# Patient Record
Sex: Male | Born: 1965 | Race: Black or African American | Hispanic: No | Marital: Married | Smoking: Never smoker
Health system: Southern US, Community
[De-identification: ages and names within clinical notes are randomized; demographics above are authoritative.]

## PROBLEM LIST (undated history)

## (undated) DIAGNOSIS — Z789 Other specified health status: Secondary | ICD-10-CM

## (undated) DIAGNOSIS — E669 Obesity, unspecified: Secondary | ICD-10-CM

## (undated) DIAGNOSIS — E785 Hyperlipidemia, unspecified: Secondary | ICD-10-CM

## (undated) HISTORY — PX: CLUB FOOT RELEASE: SHX1363

## (undated) HISTORY — DX: Hyperlipidemia, unspecified: E78.5

---

## 2006-05-24 ENCOUNTER — Emergency Department: Payer: Self-pay

## 2007-08-02 ENCOUNTER — Emergency Department: Payer: Self-pay | Admitting: Emergency Medicine

## 2011-06-22 ENCOUNTER — Emergency Department: Payer: Self-pay | Admitting: *Deleted

## 2016-01-01 ENCOUNTER — Other Ambulatory Visit: Payer: Self-pay | Admitting: Podiatry

## 2016-01-01 ENCOUNTER — Ambulatory Visit (INDEPENDENT_AMBULATORY_CARE_PROVIDER_SITE_OTHER): Payer: BLUE CROSS/BLUE SHIELD | Admitting: Podiatry

## 2016-01-01 ENCOUNTER — Ambulatory Visit: Payer: BLUE CROSS/BLUE SHIELD

## 2016-01-01 ENCOUNTER — Encounter: Payer: Self-pay | Admitting: Podiatry

## 2016-01-01 ENCOUNTER — Ambulatory Visit (INDEPENDENT_AMBULATORY_CARE_PROVIDER_SITE_OTHER): Payer: BLUE CROSS/BLUE SHIELD

## 2016-01-01 DIAGNOSIS — R52 Pain, unspecified: Secondary | ICD-10-CM | POA: Diagnosis not present

## 2016-01-01 DIAGNOSIS — M19079 Primary osteoarthritis, unspecified ankle and foot: Secondary | ICD-10-CM | POA: Insufficient documentation

## 2016-01-01 DIAGNOSIS — M19072 Primary osteoarthritis, left ankle and foot: Secondary | ICD-10-CM

## 2016-01-01 DIAGNOSIS — Z8776 Personal history of (corrected) congenital malformations of integument, limbs and musculoskeletal system: Secondary | ICD-10-CM

## 2016-01-01 DIAGNOSIS — Z87768 Personal history of other specified (corrected) congenital malformations of integument, limbs and musculoskeletal system: Secondary | ICD-10-CM | POA: Insufficient documentation

## 2016-01-01 NOTE — Progress Notes (Signed)
   Subjective:    Patient ID: Jeffery Long, male    DOB: Sep 26, 1965, 50 y.o.   MRN: [id number]  HPI   50 year old male presents the office today for concerns of left ankle pain which is been ongoing for quite some time. 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 He previously tried over-the-counter ankle brace which helps and he wears it. He works standing on concrete floors which she was area of symptoms. He states he is not working the pain subsides. No recent injury or trauma. No swelling or redness. No tenderness. No other complaints at this time.  Review of Systems  All other systems reviewed and are negative.      Objective:   Physical Exam General: AAO x3, NAD  Dermatological: Scars present from prior surgery and the posterior medial aspect of the calf as well as the medial foot. There are no open sores, no preulcerative lesions, no rash or signs of infection present.  Vascular: Dorsalis Pedis artery and Posterior Tibial artery pedal pulses are 2/4 bilateral with immedate capillary fill time. Pedal hair growth present. No varicosities and no lower extremity edema present bilateral. There is no pain with calf compression, swelling, warmth, erythema.   Neruologic: Grossly intact via light touch bilateral. Vibratory intact via tuning fork bilateral. Protective threshold with Semmes Wienstein monofilament intact to all pedal sites bilateral. Patellar and Achilles deep tendon reflexes 2+ bilateral. No Babinski or clonus noted bilateral.   Musculoskeletal: There is decreased range of motion of both ankle and subtalar joint with mild crepitus with range of motion. There is mild discomfort with subtalar joint range of motion. No pain with ankle joint range of motion. The heels in rectus alignment. There is mild forefoot adductus and elevation of the first ray. No specific area pinpoint tenderness there is no pain vibratory sensation. MMT 5/5.   Gait:  Unassisted, Nonantalgic.     Assessment & Plan:  50 year old male with history of clubfoot with ankle, subtalar joint arthritis  -Treatment options discussed including all alternatives, risks, and complications -Etiology of symptoms were discussed -X-rays were obtained and reviewed with the patient. Arthritic changes present of the ankle and subtalar joint. No evidence of acute fracture.  -At this time a discussed both conservative and surgical treatment options with the patient. At this point he would hold off on any surgical intervention. He likely benefit from an AFO brace. I'll have him follow-up with Betha.   Ovid CurdMatthew Wagoner, DPM

## 2016-01-21 ENCOUNTER — Ambulatory Visit (INDEPENDENT_AMBULATORY_CARE_PROVIDER_SITE_OTHER): Payer: BLUE CROSS/BLUE SHIELD | Admitting: *Deleted

## 2016-01-21 DIAGNOSIS — Z8776 Personal history of (corrected) congenital malformations of integument, limbs and musculoskeletal system: Secondary | ICD-10-CM

## 2016-01-21 DIAGNOSIS — M19072 Primary osteoarthritis, left ankle and foot: Secondary | ICD-10-CM

## 2016-01-21 DIAGNOSIS — Z87768 Personal history of other specified (corrected) congenital malformations of integument, limbs and musculoskeletal system: Secondary | ICD-10-CM

## 2016-01-30 NOTE — Progress Notes (Signed)
Casted for AFO brace. 

## 2016-02-18 ENCOUNTER — Ambulatory Visit (INDEPENDENT_AMBULATORY_CARE_PROVIDER_SITE_OTHER): Payer: BLUE CROSS/BLUE SHIELD | Admitting: Podiatry

## 2016-02-18 DIAGNOSIS — M19072 Primary osteoarthritis, left ankle and foot: Secondary | ICD-10-CM | POA: Diagnosis not present

## 2016-02-18 DIAGNOSIS — Z8776 Personal history of (corrected) congenital malformations of integument, limbs and musculoskeletal system: Secondary | ICD-10-CM | POA: Diagnosis not present

## 2016-02-18 DIAGNOSIS — Z87768 Personal history of other specified (corrected) congenital malformations of integument, limbs and musculoskeletal system: Secondary | ICD-10-CM

## 2016-03-01 NOTE — Progress Notes (Signed)
Dispensed AFO brace. Follow up as needed.

## 2016-03-16 DIAGNOSIS — R7303 Prediabetes: Secondary | ICD-10-CM | POA: Insufficient documentation

## 2016-03-16 DIAGNOSIS — E7800 Pure hypercholesterolemia, unspecified: Secondary | ICD-10-CM | POA: Insufficient documentation

## 2016-03-19 ENCOUNTER — Emergency Department
Admission: EM | Admit: 2016-03-19 | Discharge: 2016-03-19 | Disposition: A | Discharge status: Home / Self Care | Payer: BLUE CROSS/BLUE SHIELD | Attending: Student in an Organized Health Care Education/Training Program | Admitting: Student in an Organized Health Care Education/Training Program

## 2016-03-19 DIAGNOSIS — R21 Rash and other nonspecific skin eruption: Secondary | ICD-10-CM | POA: Diagnosis present

## 2016-03-19 DIAGNOSIS — T7840XA Allergy, unspecified, initial encounter: Secondary | ICD-10-CM | POA: Insufficient documentation

## 2016-03-19 NOTE — ED Notes (Signed)
Discharge instructions reviewed with patient. Patient verbalized understanding. Patient ambulated to lobby without difficulty.   

## 2016-03-19 NOTE — ED Provider Notes (Signed)
[facility] Emergency Department Provider Note   ____________________________________________   First MD Initiated Contact with Patient 03/19/16 1957     (approximate)  I have reviewed the triage vital signs and the nursing notes.   HISTORY  Chief Complaint Rash    HPI Jeffery Long is a 50 y.o. male who presents today for evaluation of systemic redness and itching x3 days. Patient states that the itching started after the initiation of Lipitor by his PCP. The itching starts approx. 30 minutes of taking Lipitor at night. Patient states that his whole body becomes itchy. He denies a rash but notes that there is some redness. Patient has not been taking anti-histamine medications to alleviate symptoms. He denies angioedema, SOB, or wheezing.  No past medical history on file.  Patient Active Problem List   Diagnosis Date Noted  . History of clubfoot 01/01/2016  . Osteoarthritis of ankle and foot 01/01/2016    No past surgical history on file.  Prior to Admission medications   Not on File    Allergies Review of patient's allergies indicates no known allergies.  No family history on file.  Social History Social History  Substance Use Topics  . Smoking status: Never Smoker  . Smokeless tobacco: Never Used  . Alcohol use No    Review of Systems Constitutional: No fever/chills Eyes: No visual changes. ENT: No sore throat, no difficulty swallowing Cardiovascular: Denies chest pain. Respiratory: Denies shortness of breath. Gastrointestinal: No abdominal pain.  No nausea, no vomiting.  No diarrhea.  No constipation. Skin: Positive redness and itching Neurological: Negative for headaches, focal weakness or numbness. 10-point ROS otherwise negative.  ____________________________________________   PHYSICAL EXAM:  VITAL SIGNS: ED Triage Vitals [03/19/16 1938]  Enc Vitals Group     BP (!) 145/93     Pulse Rate 73     Resp 16     Temp  98.6 F (37 C)     Temp Source Oral     SpO2 100 %     Weight 242 lb (109.8 kg)     Height 5\' 11"  (1.803 m)     Head Circumference      Peak Flow      Pain Score      Pain Loc      Pain Edu?      Excl. in GC?     Constitutional: Alert and oriented. Well appearing and in no acute distress. Eyes: Conjunctivae are normal.  Head: Atraumatic. No angioedema Mouth/Throat: Mucous membranes are moist.  Oropharynx non-erythematous.   Neck: No stridor.   Cardiovascular: Normal rate, regular rhythm. Grossly normal heart sounds.  Good peripheral circulation. Respiratory: Normal respiratory effort.  No retractions. Lungs CTAB. Musculoskeletal: No lower extremity tenderness nor edema.  No joint effusions. Neurologic:  Normal speech and language. No gross focal neurologic deficits are appreciated. No gait instability. Skin:  Skin is warm, dry and intact. No rash noted. No swelling, redness, or excoriations noted Psychiatric: Mood and affect are normal. Speech and behavior are normal.  ____________________________________________   LABS (all labs ordered are listed, but only abnormal results are displayed)  Labs Reviewed - No data to display ____________________________________________  ____________________________________________   PROCEDURES  Procedure(s) performed: None  Procedures  Critical Care performed: No  ____________________________________________   INITIAL IMPRESSION / ASSESSMENT AND PLAN / ED COURSE  Pertinent labs & imaging results that were available during my care of the patient were reviewed by me and considered in my  medical decision making (see chart for details).  Patient has been instructed to discontinue Lipitor and take anti-histamines for symptoms. He should follow up with his PCP in the next week for new cholesterol medications. He has been instructed to be aware of facial swelling or difficulty breathing, in which he should report back to the  ED.  Clinical Course     ____________________________________________   FINAL CLINICAL IMPRESSION(S) / ED DIAGNOSES  Final diagnoses:  Allergic reaction, initial encounter      NEW MEDICATIONS STARTED DURING THIS VISIT:  There are no discharge medications for this patient.    Note:  This document was prepared using Dragon voice recognition software and may include unintentional dictation errors.   Evangeline Dakin, PA-C 03/19/16 2200    Willy Eddy, MD 03/20/16 0100

## 2016-03-19 NOTE — ED Triage Notes (Signed)
Pt states was recently prescribed lipitor. Pt states he takes lipitor at night and approx 30 min after taking he has itching and redness to bilateral forearms. Pt appears in no acute distress, small amount of redness noted to right anterior mid forearm.

## 2016-04-01 ENCOUNTER — Ambulatory Visit: Payer: BLUE CROSS/BLUE SHIELD | Admitting: Podiatry

## 2016-04-08 ENCOUNTER — Ambulatory Visit: Payer: BLUE CROSS/BLUE SHIELD | Admitting: Podiatry

## 2016-06-03 ENCOUNTER — Encounter: Payer: Self-pay | Admitting: *Deleted

## 2016-06-04 ENCOUNTER — Ambulatory Visit: Payer: BLUE CROSS/BLUE SHIELD | Admitting: Anesthesiology

## 2016-06-04 ENCOUNTER — Encounter
Admission: RE | Disposition: A | Discharge status: Home / Self Care | Payer: Self-pay | Source: Ambulatory Visit | Attending: Unknown Physician Specialty

## 2016-06-04 ENCOUNTER — Ambulatory Visit
Admission: RE | Admit: 2016-06-04 | Discharge: 2016-06-04 | Disposition: A | Discharge status: Home / Self Care | Payer: BLUE CROSS/BLUE SHIELD | Source: Ambulatory Visit | Attending: Unknown Physician Specialty | Admitting: Unknown Physician Specialty

## 2016-06-04 ENCOUNTER — Encounter: Payer: Self-pay | Admitting: *Deleted

## 2016-06-04 DIAGNOSIS — Z1211 Encounter for screening for malignant neoplasm of colon: Secondary | ICD-10-CM | POA: Diagnosis not present

## 2016-06-04 DIAGNOSIS — Z538 Procedure and treatment not carried out for other reasons: Secondary | ICD-10-CM | POA: Insufficient documentation

## 2016-06-04 HISTORY — DX: Other specified health status: Z78.9

## 2016-06-04 HISTORY — DX: Obesity, unspecified: E66.9

## 2016-06-04 SURGERY — COLONOSCOPY WITH PROPOFOL
Anesthesia: General

## 2016-06-04 NOTE — Anesthesia Preprocedure Evaluation (Deleted)
Anesthesia Evaluation  Patient identified by MRN, date of birth, ID band Patient awake    Reviewed: Allergy & Precautions, H&P , NPO status , Patient's Chart, lab work & pertinent test results  History of Anesthesia Complications Negative for: history of anesthetic complications  Airway Mallampati: II  TM Distance: >3 FB Neck ROM: full    Dental  (+) Poor Dentition, Chipped, Missing   Pulmonary neg pulmonary ROS, neg shortness of breath,    Pulmonary exam normal breath sounds clear to auscultation       Cardiovascular Exercise Tolerance: Good (-) angina(-) Past MI and (-) DOE negative cardio ROS Normal cardiovascular exam Rhythm:regular Rate:Normal     Neuro/Psych negative neurological ROS  negative psych ROS   GI/Hepatic Neg liver ROS, GERD  Controlled,  Endo/Other  negative endocrine ROS  Renal/GU negative Renal ROS  negative genitourinary   Musculoskeletal  (+) Arthritis ,   Abdominal   Peds  Hematology negative hematology ROS (+)   Anesthesia Other Findings Signs and symptoms suggestive of sleep apnea    Past Surgical History: No date: CLUB FOOT RELEASE Left     Comment: as an infant     Reproductive/Obstetrics negative OB ROS                             Anesthesia Physical Anesthesia Plan  ASA: III  Anesthesia Plan: General   Post-op Pain Management:    Induction:   Airway Management Planned:   Additional Equipment:   Intra-op Plan:   Post-operative Plan:   Informed Consent: I have reviewed the patients History and Physical, chart, labs and discussed the procedure including the risks, benefits and alternatives for the proposed anesthesia with the patient or authorized representative who has indicated his/her understanding and acceptance.   Dental Advisory Given  Plan Discussed with: Anesthesiologist, CRNA and Surgeon  Anesthesia Plan Comments:          Anesthesia Quick Evaluation

## 2016-09-10 ENCOUNTER — Ambulatory Visit
Admission: RE | Admit: 2016-09-10 | Payer: BLUE CROSS/BLUE SHIELD | Source: Ambulatory Visit | Admitting: Unknown Physician Specialty

## 2016-09-10 ENCOUNTER — Encounter: Admission: RE | Payer: Self-pay | Source: Ambulatory Visit

## 2016-09-10 SURGERY — COLONOSCOPY WITH PROPOFOL
Anesthesia: General

## 2017-03-22 ENCOUNTER — Telehealth: Payer: Self-pay | Admitting: Oncology

## 2017-03-22 NOTE — Telephone Encounter (Signed)
Appt schd for 04/04/17 @ 10:30 a.m.  Confirmed with Morrie SheldonAshley at Dr referring office. Consultation for LEUKOPENIA. Ref Dr Burnadette PopLinthavong. MF. Appointment/New pt packet mailed.Notes in book. Office notified.

## 2017-04-04 ENCOUNTER — Inpatient Hospital Stay: Payer: BLUE CROSS/BLUE SHIELD

## 2017-04-04 ENCOUNTER — Inpatient Hospital Stay: Payer: BLUE CROSS/BLUE SHIELD | Attending: Oncology | Admitting: Oncology

## 2017-04-04 ENCOUNTER — Encounter: Payer: Self-pay | Admitting: Oncology

## 2017-04-04 VITALS — BP 166/105 | HR 68 | Temp 96.9°F | Ht 71.25 in | Wt 249.4 lb

## 2017-04-04 DIAGNOSIS — D696 Thrombocytopenia, unspecified: Secondary | ICD-10-CM | POA: Diagnosis not present

## 2017-04-04 DIAGNOSIS — E669 Obesity, unspecified: Secondary | ICD-10-CM | POA: Insufficient documentation

## 2017-04-04 DIAGNOSIS — Z803 Family history of malignant neoplasm of breast: Secondary | ICD-10-CM | POA: Diagnosis not present

## 2017-04-04 DIAGNOSIS — D709 Neutropenia, unspecified: Secondary | ICD-10-CM | POA: Diagnosis not present

## 2017-04-04 DIAGNOSIS — D72819 Decreased white blood cell count, unspecified: Secondary | ICD-10-CM | POA: Insufficient documentation

## 2017-04-04 DIAGNOSIS — R030 Elevated blood-pressure reading, without diagnosis of hypertension: Secondary | ICD-10-CM | POA: Insufficient documentation

## 2017-04-04 LAB — CBC WITH DIFFERENTIAL/PLATELET
BASOS PCT: 0 %
Basophils Absolute: 0 10*3/uL (ref 0–0.1)
EOS ABS: 0.1 10*3/uL (ref 0–0.7)
EOS PCT: 2 %
HCT: 41 % (ref 40.0–52.0)
Hemoglobin: 14.2 g/dL (ref 13.0–18.0)
Lymphocytes Relative: 55 %
Lymphs Abs: 1.3 10*3/uL (ref 1.0–3.6)
MCH: 31.2 pg (ref 26.0–34.0)
MCHC: 34.7 g/dL (ref 32.0–36.0)
MCV: 89.8 fL (ref 80.0–100.0)
MONO ABS: 0.2 10*3/uL (ref 0.2–1.0)
Monocytes Relative: 11 %
Neutro Abs: 0.7 10*3/uL — ABNORMAL LOW (ref 1.4–6.5)
Neutrophils Relative %: 32 %
PLATELETS: 159 10*3/uL (ref 150–440)
RBC: 4.56 MIL/uL (ref 4.40–5.90)
RDW: 13.5 % (ref 11.5–14.5)
WBC: 2.3 10*3/uL — ABNORMAL LOW (ref 3.8–10.6)

## 2017-04-04 LAB — FOLATE: FOLATE: 9.5 ng/mL (ref >5.9)

## 2017-04-04 LAB — COMPREHENSIVE METABOLIC PANEL
ALT: 26 U/L (ref 17–63)
AST: 26 U/L (ref 15–41)
Albumin: 4.1 g/dL (ref 3.5–5.0)
Alkaline Phosphatase: 65 U/L (ref 38–126)
Anion gap: 7 (ref 5–15)
BUN: 12 mg/dL (ref 6–20)
CHLORIDE: 105 mmol/L (ref 101–111)
CO2: 27 mmol/L (ref 22–32)
CREATININE: 0.9 mg/dL (ref 0.61–1.24)
Calcium: 9.2 mg/dL (ref 8.9–10.3)
Glucose, Bld: 108 mg/dL — ABNORMAL HIGH (ref 65–99)
POTASSIUM: 4 mmol/L (ref 3.5–5.1)
SODIUM: 139 mmol/L (ref 135–145)
Total Bilirubin: 0.7 mg/dL (ref 0.3–1.2)
Total Protein: 7.5 g/dL (ref 6.5–8.1)

## 2017-04-04 LAB — TSH: TSH: 2.495 u[IU]/mL (ref 0.350–4.500)

## 2017-04-04 LAB — TECHNOLOGIST SMEAR REVIEW: Tech Review: ADEQUATE

## 2017-04-04 LAB — VITAMIN B12: Vitamin B-12: 461 pg/mL (ref 180–914)

## 2017-04-04 NOTE — Progress Notes (Signed)
Hematology/Oncology Consult note [facility] Telephone:([phone] [phone]  CONSULT NOTE Patient Care Team: Patient, No Pcp Per as PCP - General (General Practice)  CHIEF COMPLAINTS/PURPOSE OF CONSULTATION:   I have low blood counts  HISTORY OF PRESENTING ILLNESS:  Jeffery Long 51 y.o.  male with past medical history listed as below who was referred by Dr.Kannka to me for evaluation of leukopenia. Patient reports feeling well, at his usual state of health, except that he has lot of stress recently from work as well as the lost of his father.  he follows up with Dr. Trisha Mangle, Dewayne Hatch has lab work done on 03/17/2017 which showed a WBC of 2.7 hemoglobin 14.4 MCV 87.8 platelet 148,000. Neutrophil 0.79. He also had lab work done in 2017 July which showed WBC 2.6 normal hemoglobin normal platelet absolute neutrophil count is 0.87. Patient denies frequent infections, night sweats, weight loss, chest pain, shortness of breath, abdominal pain, lower extremity swelling. He denies smoking. He occasionally drink at the weekends. He described as every other week and he will have a few beers and hard liquor shots. He denies taking any medications or herbal supplementation.  ROS:  Review of Systems  Constitutional: Negative.   All other systems reviewed and are negative.   MEDICAL HISTORY:  Past Medical History:  Diagnosis Date  . Medical history non-contributory   . Obesity     SURGICAL HISTORY: Past Surgical History:  Procedure Laterality Date  . CLUB FOOT RELEASE Left    as an infant    SOCIAL HISTORY: Social History   Social History  . Marital status: Married    Spouse name: N/A  . Number of children: N/A  . Years of education: N/A   Occupational History  . Not on file.   Social History Main Topics  . Smoking status: Never Smoker  . Smokeless tobacco: Never Used  . Alcohol use No  . Drug use: No  . Sexual activity: Not on file   Other  Topics Concern  . Not on file   Social History Narrative  . No narrative on file    FAMILY HISTORY: Family History  Problem Relation Age of Onset  . Diabetes Mother   . Kidney disease Father   . Stroke Father   . Cancer Maternal Grandfather     ALLERGIES:  is allergic to lovastatin.  MEDICATIONS:  No current outpatient prescriptions on file.   No current facility-administered medications for this visit.       Marland Kitchen  PHYSICAL EXAMINATION: ECOG PERFORMANCE STATUS: 0 - Asymptomatic Vitals:   04/04/17 1106 04/04/17 1110  BP: (!) 188/106 (!) 166/105  Pulse: 69 68  Temp: (!) 96.9 F (36.1 C)    Filed Weights   04/04/17 1106  Weight: 249 lb 6 oz (113.1 kg)    GENERAL: Well-nourished well-developed; Alert, no distress and comfortable.  EYES: no pallor or icterus OROPHARYNX: no thrush or ulceration; good dentition  NECK: supple, no masses felt LYMPH:  no palpable lymphadenopathy in the cervical, axillary or inguinal regions LUNGS: clear to auscultation and  No wheeze or crackles HEART/CVS: regular rate & rhythm and no murmurs; No lower extremity edema ABDOMEN: abdomen soft, non-tender and normal bowel sounds Musculoskeletal:no cyanosis of digits and no clubbing  PSYCH: alert & oriented x 3  NEURO: no focal motor/sensory deficits SKIN:  no rashes or significant lesions  LABORATORY DATA:  I have reviewed the data as listed No results found for: WBC, HGB, HCT, MCV,  PLT No results for input(s): NA, K, CL, CO2, GLUCOSE, BUN, CREATININE, CALCIUM, GFRNONAA, GFRAA, PROT, ALBUMIN, AST, ALT, ALKPHOS, BILITOT, BILIDIR, IBILI in the last 8760 hours.  Labs from [facility] nursing home reviewed 03/17/2017 CBC WBC 2.7, hemoglobin 14.4 hematocrit 40.4 MCV 87.8 platelet count 148,000 absolute neutrophils 0.79 neutrophil percentage 29.5 lymphocyte percentage 55.2 absolute lymphocyte 1.48 other differential normal 03/15/2016 WBC 2.6 hemoglobin 14.9, hematocrit 42.9 MCV 87.4 platelet count  161,000 absolute neutrophils 0.87 neutrophil% is 3.2 lymphocyte% 54.2, absolute lymphocyte 1.4    RADIOGRAPHIC STUDIES: I have personally reviewed the radiological images as listed and agreed with the findings in the report. No results found. No recent images.  ASSESSMENT & PLAN:  1. Neutropenia, unspecified type (HCC)   2. Elevated blood pressure reading   3. Thrombocytopenia (HCC)    #I discussed with the patient about most recent CBC test. I explained to patient that neutropenia can be a result of many conditions, such as infection/inflammation, benign ethnic neutropenia, medication-related, nutrition deficiency, congenital.  First step is to repeat another CBC with differential to see if the neutropenia persists or not.  We'll also send some basic lab work up including TSH, pathology smear review,  folate, B12, ANA, flowcytometry. Hepatitis and HIV Patient can follow-up with me to discuss results and decide if any further workup/treatment is needed.  # Regarding to his elevated blood pressure sugar reading, he doesn't carry a tissue diagnosis of hypertension. Can be related to anxiety during this encounter. Suggest patient t related too follow up closely with his primary care physician to monitor his blood pressure, and if needed start antihypertensive measures.   All questions were answered. The patient knows to call the clinic with any problems questions or concerns.  Return of visit: 2 weeks  Thank you for this kind referral and the opportunity to participate in the care of this patient. A copy of today's note is routed to referring provider Dr.Kannka   Rickard Patience, MD, PhD Hematology Oncology [facility] at [facility] Pager- [phone] 04/04/2017   Office

## 2017-04-05 LAB — HEPATITIS PANEL, ACUTE
HEP B C IGM: NEGATIVE
Hep A IgM: NEGATIVE
Hepatitis B Surface Ag: NEGATIVE

## 2017-04-05 LAB — ANTINUCLEAR ANTIBODIES, IFA: ANA Ab, IFA: POSITIVE — AB

## 2017-04-05 LAB — HIV ANTIBODY (ROUTINE TESTING W REFLEX): HIV Screen 4th Generation wRfx: NONREACTIVE

## 2017-04-05 LAB — FANA STAINING PATTERNS

## 2017-04-06 LAB — COMP PANEL: LEUKEMIA/LYMPHOMA

## 2017-04-12 LAB — NEUTROPHIL AB TEST LEVEL 1: NEUTROPHIL SCR/PANEL INTERP.: NEGATIVE

## 2017-04-19 ENCOUNTER — Inpatient Hospital Stay: Payer: BLUE CROSS/BLUE SHIELD | Attending: Oncology | Admitting: Oncology

## 2017-04-19 ENCOUNTER — Inpatient Hospital Stay: Payer: BLUE CROSS/BLUE SHIELD

## 2017-04-19 ENCOUNTER — Encounter: Payer: Self-pay | Admitting: Oncology

## 2017-04-19 ENCOUNTER — Other Ambulatory Visit: Payer: Self-pay

## 2017-04-19 VITALS — BP 136/76 | HR 97 | Temp 97.0°F | Wt 250.0 lb

## 2017-04-19 DIAGNOSIS — E709 Disorder of aromatic amino-acid metabolism, unspecified: Secondary | ICD-10-CM

## 2017-04-19 DIAGNOSIS — D709 Neutropenia, unspecified: Secondary | ICD-10-CM | POA: Diagnosis not present

## 2017-04-19 DIAGNOSIS — R7989 Other specified abnormal findings of blood chemistry: Secondary | ICD-10-CM | POA: Diagnosis not present

## 2017-04-19 DIAGNOSIS — E785 Hyperlipidemia, unspecified: Secondary | ICD-10-CM | POA: Diagnosis not present

## 2017-04-19 DIAGNOSIS — E669 Obesity, unspecified: Secondary | ICD-10-CM

## 2017-04-19 DIAGNOSIS — D72819 Decreased white blood cell count, unspecified: Secondary | ICD-10-CM | POA: Diagnosis not present

## 2017-04-19 DIAGNOSIS — R030 Elevated blood-pressure reading, without diagnosis of hypertension: Secondary | ICD-10-CM

## 2017-04-19 DIAGNOSIS — R768 Other specified abnormal immunological findings in serum: Secondary | ICD-10-CM

## 2017-04-19 LAB — CBC WITH DIFFERENTIAL/PLATELET
Basophils Absolute: 0 10*3/uL (ref 0–0.1)
Basophils Relative: 0 %
Eosinophils Absolute: 0.1 10*3/uL (ref 0–0.7)
Eosinophils Relative: 2 %
HCT: 40.5 % (ref 40.0–52.0)
Hemoglobin: 14 g/dL (ref 13.0–18.0)
LYMPHS ABS: 1.5 10*3/uL (ref 1.0–3.6)
LYMPHS PCT: 51 %
MCH: 31.2 pg (ref 26.0–34.0)
MCHC: 34.7 g/dL (ref 32.0–36.0)
MCV: 89.9 fL (ref 80.0–100.0)
MONO ABS: 0.4 10*3/uL (ref 0.2–1.0)
MONOS PCT: 12 %
NEUTROS ABS: 1 10*3/uL — AB (ref 1.4–6.5)
Neutrophils Relative %: 35 %
Platelets: 143 10*3/uL — ABNORMAL LOW (ref 150–440)
RBC: 4.5 MIL/uL (ref 4.40–5.90)
RDW: 13.4 % (ref 11.5–14.5)
WBC: 3 10*3/uL — ABNORMAL LOW (ref 3.8–10.6)

## 2017-04-19 LAB — PATHOLOGIST SMEAR REVIEW

## 2017-04-19 NOTE — Progress Notes (Signed)
Patient here today for follow up.  Patient states no new concerns today  

## 2017-04-19 NOTE — Progress Notes (Signed)
Hematology/Oncology Consult note [facility] Telephone:([phone] Fax:([phone]  CONSULT NOTE Patient Care Team: Patient, No Pcp Per as PCP - General (General Practice)  CHIEF COMPLAINTS/PURPOSE OF CONSULTATION:   I have low blood counts  HISTORY OF PRESENTING ILLNESS:  Jeffery Long 51 y.o.  male with past medical history listed as below who was referred by Dr.Kannka to me for evaluation of leukopenia. Patient reports feeling well, at his usual state of health, except that he has lot of stress recently from work as well as the lost of his father.  he follows up with Dr. Lucianne Muss, Lelon Frohlich has lab work done on 03/17/2017 which showed a WBC of 2.7 hemoglobin 14.4 MCV 87.8 platelet 148,000. Neutrophil 0.79. He also had lab work done in 2017 July which showed WBC 2.6 normal hemoglobin normal platelet absolute neutrophil count is 0.87. Patient denies frequent infections, night sweats, weight loss, chest pain, shortness of breath, abdominal pain, lower extremity swelling. He denies smoking. He occasionally drink at the weekends. He described as every other week and he will have a few beers and hard liquor shots. He denies taking any medications or herbal supplementation.  INTERVAL HISTORY Patient presents to discuss about the results. No new complaints. Denies any frequent infections, weight loss, fatigue.   He takes couple hard liqur shots  and some beers every other weekend.   ROS:  Review of Systems  Constitutional: Negative.   All other systems reviewed and are negative.   MEDICAL HISTORY:  Past Medical History:  Diagnosis Date  . Hyperlipemia   . Medical history non-contributory   . Obesity     SURGICAL HISTORY: Past Surgical History:  Procedure Laterality Date  . CLUB FOOT RELEASE Left    as an infant    SOCIAL HISTORY: Social History   Social History  . Marital status: Married    Spouse name: N/A  . Number of children: N/A  . Years of  education: N/A   Occupational History  . Not on file.   Social History Main Topics  . Smoking status: Never Smoker  . Smokeless tobacco: Never Used  . Alcohol use 1.8 oz/week    1 Shots of liquor, 2 Cans of beer per week     Comment: "every other weekend"  . Drug use: No  . Sexual activity: Not on file   Other Topics Concern  . Not on file   Social History Narrative  . No narrative on file    FAMILY HISTORY: Family History  Problem Relation Age of Onset  . Diabetes Mother   . Kidney disease Father   . Stroke Father   . Cancer Maternal Grandfather     ALLERGIES:  is allergic to lovastatin.  MEDICATIONS:  No current outpatient prescriptions on file.   No current facility-administered medications for this visit.       Marland Kitchen  PHYSICAL EXAMINATION: ECOG PERFORMANCE STATUS: 0 - Asymptomatic Vitals:   04/19/17 0858  BP: 136/76  Pulse: 97  Temp: (!) 97 F (36.1 C)   Filed Weights   04/19/17 0858  Weight: 250 lb (113.4 kg)    GENERAL: Well-nourished well-developed; Alert, no distress and comfortable.  EYES: no pallor or icterus OROPHARYNX: no thrush or ulceration; good dentition  NECK: supple, no masses felt LYMPH:  no palpable lymphadenopathy in the cervical, axillary or inguinal regions LUNGS: clear to auscultation and  No wheeze or crackles HEART/CVS: regular rate & rhythm and no murmurs; No lower extremity edema ABDOMEN:  abdomen soft, non-tender and normal bowel sounds Musculoskeletal:no cyanosis of digits and no clubbing  PSYCH: alert & oriented x 3  NEURO: no focal motor/sensory deficits SKIN:  no rashes or significant lesions  LABORATORY DATA:  I have reviewed the data as listed Lab Results  Component Value Date   WBC 2.3 (L) 04/04/2017   HGB 14.2 04/04/2017   HCT 41.0 04/04/2017   MCV 89.8 04/04/2017   PLT 159 04/04/2017    Recent Labs  04/04/17 1142  NA 139  K 4.0  CL 105  CO2 27  GLUCOSE 108*  BUN 12  CREATININE 0.90  CALCIUM 9.2    GFRNONAA >60  GFRAA >60  PROT 7.5  ALBUMIN 4.1  AST 26  ALT 26  ALKPHOS 65  BILITOT 0.7   Negative neutrophil antibodies.  Negative hepatitis panel, HIV, flow cytometry, Normal B12 ,folate. TSh.   Results for Jeffery Long, Jeffery Long (MRN [id number]) as of 04/19/2017 12:06  Ref. Range 04/04/2017 11:42  ANA Ab, IFA Unknown Positive (A)  Nucleolar Pattern Unknown 1:80    Labs from [facility] home reviewed 03/17/2017 CBC WBC 2.7, hemoglobin 14.4 hematocrit 40.4 MCV 87.8 platelet count 148,000 absolute neutrophils 0.79 neutrophil percentage 29.5 lymphocyte percentage 55.2 absolute lymphocyte 1.48 other differential normal 03/15/2016 WBC 2.6 hemoglobin 14.9, hematocrit 42.9 MCV 87.4 platelet count 161,000 absolute neutrophils 0.87 neutrophil% is 3.2 lymphocyte% 54.2, absolute lymphocyte 1.4    RADIOGRAPHIC STUDIES: I have personally reviewed the radiological images as listed and agreed with the findings in the report. No recent images.  ASSESSMENT & PLAN:  1. Neutropenia, unspecified type ([place])   2. Elevated blood pressure reading   3. ANA positive    #I discussed with the patient about lab results. He has persistent neutropenia, and borderline thrombocytopenia.  Normal TSH,folate, B12,flowcytometry. Negative neutrophil antibody, Hepatitis and HIV, positive ANA.  So far work up is not conclusive except mild titer of ANA, possible underlying rheumatological issue and/or LGL? I recommend bone marrow biopsy for further work up. Patient declined as he feels well.  Will check T cell receptor rearrangement, CBC, and pathology smear review.  .  # Regarding to his elevated blood pressure reading: resolved. Likely anxiety.   All questions were answered. The patient knows to call the clinic with any problems questions or concerns.  Return of visit:  3 months with labs.   Earlie Server, MD, PhD Hematology Oncology [facility] at [facility] Pager-  [phone] 04/19/2017   Office

## 2017-04-26 LAB — MISC LABCORP TEST (SEND OUT): Labcorp test code: 481080

## 2017-07-18 ENCOUNTER — Other Ambulatory Visit: Payer: Self-pay | Admitting: *Deleted

## 2017-07-18 DIAGNOSIS — D709 Neutropenia, unspecified: Secondary | ICD-10-CM

## 2017-07-18 NOTE — Progress Notes (Deleted)
Hematology/Oncology Consult note [facility] Telephone:([phone] [phone]  CONSULT NOTE Patient Care Team: Patient, No Pcp Per as PCP - General (General Practice)  CHIEF COMPLAINTS/PURPOSE OF CONSULTATION:   I have low blood counts  HISTORY OF PRESENTING ILLNESS:  Jeffery Long 51 y.o.  male with past medical history listed as below who was referred by Dr.Kannka to me for evaluation of leukopenia. Patient reports feeling well, at his usual state of health, except that he has lot of stress recently from work as well as the lost of his father.  he follows up with Dr. Lucianne Muss, Lelon Frohlich has lab work done on 03/17/2017 which showed a WBC of 2.7 hemoglobin 14.4 MCV 87.8 platelet 148,000. Neutrophil 0.79. He also had lab work done in 2017 July which showed WBC 2.6 normal hemoglobin normal platelet absolute neutrophil count is 0.87. Patient denies frequent infections, night sweats, weight loss, chest pain, shortness of breath, abdominal pain, lower extremity swelling. He denies smoking. He occasionally drink at the weekends. He described as every other week and he will have a few beers and hard liquor shots. He denies taking any medications or herbal supplementation.  INTERVAL HISTORY Patient presents to discuss about the results. No new complaints. Denies any frequent infections, weight loss, fatigue.   He takes couple hard liqur shots  and some beers every other weekend.   ROS:  Review of Systems  Constitutional: Negative.   All other systems reviewed and are negative.   MEDICAL HISTORY:  Past Medical History:  Diagnosis Date  . Hyperlipemia   . Medical history non-contributory   . Obesity     SURGICAL HISTORY: Past Surgical History:  Procedure Laterality Date  . 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 Marital status: Married    Spouse name: Not on file  . Number of children: Not on  file  . Years of education: Not on file  . Highest education level: Not on file  Social Needs  . Financial resource strain: Not on file  . Food insecurity - worry: Not on file  . Food insecurity - inability: Not on file  . Transportation needs - medical: Not on file  . Transportation needs - non-medical: Not on file  Occupational History  . Not on file  Tobacco Use  . Smoking status: Never Smoker  . Smokeless tobacco: Never Used  Substance and Sexual Activity  . Alcohol use: Yes    Alcohol/week: 1.8 oz    Types: 1 Shots of liquor, 2 Cans of beer per week    Comment: "every other weekend"  . Drug use: No  . Sexual activity: Not on file  Other Topics Concern  . Not on file  Social History Narrative  . Not on file    FAMILY HISTORY: Family History  Problem Relation Age of Onset  . Diabetes Mother   . Kidney disease Father   . Stroke Father   . Cancer Maternal Grandfather     ALLERGIES:  is allergic to lovastatin.  MEDICATIONS:  No current outpatient medications on file.   No current facility-administered medications for this visit.       Marland Kitchen  PHYSICAL EXAMINATION: ECOG PERFORMANCE STATUS: 0 - Asymptomatic There were no vitals filed for this visit. There were no vitals filed for this visit.  GENERAL: Well-nourished well-developed; Alert, no distress and comfortable.  EYES: no pallor or icterus OROPHARYNX: no  thrush or ulceration; good dentition  NECK: supple, no masses felt LYMPH:  no palpable lymphadenopathy in the cervical, axillary or inguinal regions LUNGS: clear to auscultation and  No wheeze or crackles HEART/CVS: regular rate & rhythm and no murmurs; No lower extremity edema ABDOMEN: abdomen soft, non-tender and normal bowel sounds Musculoskeletal:no cyanosis of digits and no clubbing  PSYCH: alert & oriented x 3  NEURO: no focal motor/sensory deficits SKIN:  no rashes or significant lesions  LABORATORY DATA:  I have reviewed the data as listed Lab  Results  Component Value Date   WBC 3.0 (L) 04/19/2017   HGB 14.0 04/19/2017   HCT 40.5 04/19/2017   MCV 89.9 04/19/2017   PLT 143 (L) 04/19/2017   Recent Labs    04/04/17 1142  NA 139  K 4.0  CL 105  CO2 27  GLUCOSE 108*  BUN 12  CREATININE 0.90  CALCIUM 9.2  GFRNONAA >60  GFRAA >60  PROT 7.5  ALBUMIN 4.1  AST 26  ALT 26  ALKPHOS 65  BILITOT 0.7   Negative neutrophil antibodies.  Negative hepatitis panel, HIV, flow cytometry, Normal B12 ,folate. TSh.   Results for Jeffery, Long (MRN [id number]) as of 04/19/2017 12:06  Ref. Range 04/04/2017 11:42  ANA Ab, IFA Unknown Positive (A)  Nucleolar Pattern Unknown 1:80    Labs from [place] home reviewed 03/17/2017 CBC WBC 2.7, hemoglobin 14.4 hematocrit 40.4 MCV 87.8 platelet count 148,000 absolute neutrophils 0.79 neutrophil percentage 29.5 lymphocyte percentage 55.2 absolute lymphocyte 1.48 other differential normal 03/15/2016 WBC 2.6 hemoglobin 14.9, hematocrit 42.9 MCV 87.4 platelet count 161,000 absolute neutrophils 0.87 neutrophil% is 3.2 lymphocyte% 54.2, absolute lymphocyte 1.4    RADIOGRAPHIC STUDIES: I have personally reviewed the radiological images as listed and agreed with the findings in the report. No recent images.  ASSESSMENT & PLAN:  No diagnosis found. #I discussed with the patient about lab results. He has persistent neutropenia, and borderline thrombocytopenia.  Normal TSH,folate, B12,flowcytometry. Negative neutrophil antibody, Hepatitis and HIV, positive ANA.  So far work up is not conclusive except mild titer of ANA, possible underlying rheumatological issue and/or LGL? I recommend bone marrow biopsy for further work up. Patient declined as he feels well.  Will check T cell receptor rearrangement, CBC, and pathology smear review.  .  # Regarding to his elevated blood pressure reading: resolved. Likely anxiety.   All questions were answered. The patient knows to call the clinic with any  problems questions or concerns.  Return of visit:  3 months with labs.   Earlie Server, MD, PhD Hematology Oncology [facility] Pager- [phone]

## 2017-07-19 ENCOUNTER — Inpatient Hospital Stay: Payer: BLUE CROSS/BLUE SHIELD | Admitting: Oncology

## 2017-07-19 ENCOUNTER — Inpatient Hospital Stay: Payer: BLUE CROSS/BLUE SHIELD

## 2017-07-20 ENCOUNTER — Telehealth: Payer: Self-pay | Admitting: Oncology

## 2017-07-20 NOTE — Telephone Encounter (Signed)
Called patient to [facility]d 07/18/17 No Show appt. Unable to leave message as per patient does not have voice mail set up.

## 2019-11-26 DIAGNOSIS — E669 Obesity, unspecified: Secondary | ICD-10-CM | POA: Insufficient documentation

## 2019-11-27 ENCOUNTER — Ambulatory Visit (INDEPENDENT_AMBULATORY_CARE_PROVIDER_SITE_OTHER): Payer: 59 | Admitting: Podiatry

## 2019-11-27 ENCOUNTER — Ambulatory Visit (INDEPENDENT_AMBULATORY_CARE_PROVIDER_SITE_OTHER): Payer: PRIVATE HEALTH INSURANCE

## 2019-11-27 ENCOUNTER — Other Ambulatory Visit: Payer: Self-pay

## 2019-11-27 ENCOUNTER — Encounter: Payer: Self-pay | Admitting: Podiatry

## 2019-11-27 DIAGNOSIS — M19072 Primary osteoarthritis, left ankle and foot: Secondary | ICD-10-CM

## 2019-11-27 DIAGNOSIS — B351 Tinea unguium: Secondary | ICD-10-CM

## 2019-11-27 DIAGNOSIS — M779 Enthesopathy, unspecified: Secondary | ICD-10-CM

## 2019-11-27 MED ORDER — MELOXICAM 15 MG PO TABS: 15.0000 mg | ORAL_TABLET | Freq: Every day | ORAL | 1 refills | Status: DC

## 2019-11-27 MED ORDER — TERBINAFINE HCL 250 MG PO TABS: 250.0000 mg | ORAL_TABLET | Freq: Every day | ORAL | 0 refills | Status: AC

## 2019-11-29 ENCOUNTER — Other Ambulatory Visit: Payer: Self-pay | Admitting: Podiatry

## 2019-11-29 DIAGNOSIS — M19072 Primary osteoarthritis, left ankle and foot: Secondary | ICD-10-CM

## 2019-12-02 NOTE — Progress Notes (Signed)
   Subjective:  54 y.o. male presenting today with a chief complaint of aching, sharp, stabbing pain to the left ankle that has been ongoing for the past several years. 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 Walking and standing increases the pain. He also reports possible fungal nails 1-5 bilaterally that have been present for the past several years. He states wearing boots for work likely causes the symptoms to worsen. He has been taking Aleve and using an ankle brace for treatment. Patient is here for further evaluation and treatment.   Past Medical History:  Diagnosis Date  . Hyperlipemia   . Medical history non-contributory   . Obesity      Objective / Physical Exam:  General:  The patient is alert and oriented x3 in no acute distress. Dermatology:  Hyperkeratotic, discolored, thickened, onychodystrophy noted to nails 1-5 bilaterally. Skin is warm, dry and supple bilateral lower extremities. Negative for open lesions or macerations. Vascular:  Palpable pedal pulses bilaterally. No edema or erythema noted. Capillary refill within normal limits. Neurological:  Epicritic and protective threshold grossly intact bilaterally.  Musculoskeletal Exam:  Pain on palpation to the anterior lateral medial aspects of the patient's left ankle. Mild edema noted. Range of motion within normal limits to all pedal and ankle joints bilateral. Muscle strength 5/5 in all groups bilateral.   Radiographic Exam:  Normal osseous mineralization. Joint spaces preserved. No fracture/dislocation/boney destruction.    Assessment: 1. H/o clubfoot left  2. Ankle synovitis / DJD left  3. Onychomycosis nails 1-5 bilateral  Plan of Care:  1. Patient was evaluated. X-Rays reviewed.  2. Injection of 0.5 mL Celestone Soluspan injected in the patient's left ankle. 3. Prescription for Meloxicam provided to patient. 4. Ankle brace dispensed.  5. Prescription for Lamisil 250 mg #90 provided to patient.  6. Return to clinic  as needed.    Felecia Shelling, DPM [facility]  Dr. Felecia Shelling, DPM    [address]                                        [place], [place] [address]                Office [phone]  Fax [phone]

## 2020-01-01 ENCOUNTER — Encounter: Payer: Self-pay | Admitting: Emergency Medicine

## 2020-01-01 ENCOUNTER — Emergency Department: Payer: 59

## 2020-01-01 ENCOUNTER — Other Ambulatory Visit: Payer: Self-pay

## 2020-01-01 ENCOUNTER — Emergency Department
Admission: EM | Admit: 2020-01-01 | Discharge: 2020-01-01 | Disposition: A | Discharge status: Home / Self Care | Payer: 59 | Attending: Emergency Medicine | Admitting: Emergency Medicine

## 2020-01-01 DIAGNOSIS — Z79899 Other long term (current) drug therapy: Secondary | ICD-10-CM | POA: Insufficient documentation

## 2020-01-01 DIAGNOSIS — G43001 Migraine without aura, not intractable, with status migrainosus: Secondary | ICD-10-CM | POA: Insufficient documentation

## 2020-01-01 MED ORDER — METOCLOPRAMIDE HCL 5 MG/ML IJ SOLN
10.0000 mg | Freq: Once | INTRAMUSCULAR | Status: AC
  Administered 2020-01-01: 10 mg via INTRAVENOUS
  Filled 2020-01-01: qty 2

## 2020-01-01 MED ORDER — KETOROLAC TROMETHAMINE 30 MG/ML IJ SOLN
30.0000 mg | Freq: Once | INTRAMUSCULAR | Status: AC
  Administered 2020-01-01: 30 mg via INTRAVENOUS
  Filled 2020-01-01: qty 1

## 2020-01-01 MED ORDER — SODIUM CHLORIDE 0.9 % IV BOLUS
1000.0000 mL | Freq: Once | INTRAVENOUS | Status: AC
  Administered 2020-01-01: 1000 mL via INTRAVENOUS

## 2020-01-01 MED ORDER — KETOROLAC TROMETHAMINE 10 MG PO TABS: 10.0000 mg | ORAL_TABLET | Freq: Four times a day (QID) | ORAL | 0 refills | Status: AC | PRN

## 2020-01-01 MED ORDER — DEXAMETHASONE SODIUM PHOSPHATE 10 MG/ML IJ SOLN
10.0000 mg | Freq: Once | INTRAMUSCULAR | Status: AC
  Administered 2020-01-01: 10 mg via INTRAVENOUS
  Filled 2020-01-01: qty 1

## 2020-01-01 MED ORDER — DIPHENHYDRAMINE HCL 50 MG/ML IJ SOLN
25.0000 mg | Freq: Once | INTRAMUSCULAR | Status: AC
  Administered 2020-01-01: 25 mg via INTRAVENOUS
  Filled 2020-01-01: qty 1

## 2020-01-01 NOTE — ED Notes (Signed)
Pt states intermittent migraine for the last few days. States it will come and go, 5/10 pain at this moment. Pain is in temple and forehead. Pt states he took and Aleve last night with some relief and aspirin today with no relief. No hx of migraines. Reports COVId vacc 2-3 weeks ago and feels there may be a connection.

## 2020-01-01 NOTE — ED Triage Notes (Signed)
Pt presents to ED via POV with c/o migraine x 2 weeks. Pt A&O x4 at this time, ambulatory with steady gait. Pt states received covid vaccine several weeks ago.

## 2020-01-01 NOTE — ED Provider Notes (Signed)
Emergency Department Provider Note  ____________________________________________  Time seen: Approximately 10:29 PM  I have reviewed the triage vital signs and the nursing notes.   HISTORY  Chief Complaint Headache   Historian Patient     HPI Jeffery Long is a 54 y.o. male presents to the emergency department with a frontal headache for the past 2 to 3 weeks.  Patient states that the intensity of the pain comes and goes but he currently rates his headache at 5 out of 10 in intensity.  He denies any history of migraines.  He reports that the pain came on slowly and progressed in intensity over time.  He has had no photophobia or phonophobia and denies nausea or vomiting.  He has tried Tylenol and Goody powders at home which have not relieved his symptoms.  He has been afebrile.  No falls or mechanisms of trauma.  No visual changes.  Patient has been staying hydrated at home.  All patient questions were answered.   Past Medical History:  Diagnosis Date  . Hyperlipemia   . Medical history non-contributory   . Obesity      Immunizations up to date:  Yes.     Past Medical History:  Diagnosis Date  . Hyperlipemia   . Medical history non-contributory   . Obesity     Patient Active Problem List   Diagnosis Date Noted  . Obesity (BMI 30-39.9) 11/26/2019  . Leukopenia 04/04/2017  . 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 Pure hypercholesterolemia 03/16/2016  . History of clubfoot 01/01/2016  . 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 Authorizing Provider  atorvastatin (LIPITOR) 20 MG tablet Take by mouth. 10/23/19 10/22/20  [provider]  ketorolac (TORADOL) 10 MG tablet Take 1 tablet (10 mg total) by mouth every 6 (six) hours as needed for up to 5 days. 01/01/20 01/06/20  Orvil Feil, PA-C  meloxicam (MOBIC) 15 MG tablet Take 1 tablet (15 mg total) by mouth daily. 11/27/19   Felecia Shelling, DPM  naproxen sodium (ALEVE) 220 MG tablet Take 220 mg by mouth.    [provider]  terbinafine (LAMISIL) 250 MG tablet Take 1 tablet (250 mg total) by mouth daily. 11/27/19   Felecia Shelling, DPM    Allergies Lovastatin  Family History  Problem Relation Age of Onset  . Diabetes Mother   . Kidney disease Father   . Stroke Father   . Cancer Maternal Grandfather     Social History Social History   Tobacco Use  . Smoking status: Never Smoker  . Smokeless tobacco: Never Used  Substance Use Topics  . Alcohol use: Yes    Alcohol/week: 3.0 standard drinks    Types: 1 Shots of liquor, 2 Cans of beer per week    Comment: "every other weekend"  . Drug use: No     Review of Systems  Constitutional: No fever/chills Eyes:  No discharge ENT: No upper respiratory complaints. Respiratory: no cough. No SOB/ use of accessory muscles to breath Gastrointestinal:   No nausea, no vomiting.  No diarrhea.  No constipation. Musculoskeletal: Negative for musculoskeletal pain. Neuro: Patient has headache.  Skin: Negative for rash, abrasions, lacerations, ecchymosis.    ____________________________________________   PHYSICAL EXAM:  VITAL SIGNS: ED Triage Vitals [01/01/20  1748]  Enc Vitals Group     BP (!) 167/86     Pulse Rate 72     Resp 18     Temp 98.4 F (36.9 C)     Temp Source Oral     SpO2 97 %     Weight 245 lb (111.1 kg)     Height 5' 11.5" (1.816 m)     Head Circumference      Peak Flow      Pain Score 5     Pain Loc      Pain Edu?      Excl. in [place]?      Constitutional: Alert and oriented. Well appearing and in no acute distress. Eyes: Conjunctivae are normal. PERRL. EOMI. Head: Atraumatic. ENT:       Nose: No congestion/rhinnorhea.      Mouth/Throat: Mucous membranes are moist.  Neck: No stridor.  Full range of motion.  No midline  C-spine tenderness. Cardiovascular: Normal rate, regular rhythm. Normal S1 and S2.  Good peripheral circulation. Respiratory: Normal respiratory effort without tachypnea or retractions. Lungs CTAB. Good air entry to the bases with no decreased or absent breath sounds Gastrointestinal: Bowel sounds x 4 quadrants. Soft and nontender to palpation. No guarding or rigidity. No distention. Musculoskeletal: Full range of motion to all extremities. No obvious deformities noted Neurologic:  Normal for age. No gross focal neurologic deficits are appreciated.  Skin:  Skin is warm, dry and intact. No rash noted. Psychiatric: Mood and affect are normal for age. Speech and behavior are normal.   ____________________________________________   LABS (all labs ordered are listed, but only abnormal results are displayed)  Labs Reviewed - No data to display ____________________________________________  EKG   ____________________________________________  RADIOLOGY Unk Pinto, personally viewed and evaluated these images (plain radiographs) as part of my medical decision making, as well as reviewing the written report by the radiologist.  CT Head Wo Contrast  Result Date: 01/01/2020 CLINICAL DATA:  Cerebral hemorrhage suspected Headaches. EXAM: CT HEAD WITHOUT CONTRAST TECHNIQUE: Contiguous axial images were obtained from the base of the skull through the vertex without intravenous contrast. COMPARISON:  None. FINDINGS: Brain: No intracranial hemorrhage, mass effect, or midline shift. No hydrocephalus. The basilar cisterns are patent. No evidence of territorial infarct or acute ischemia. No extra-axial or intracranial fluid collection. Vascular: No hyperdense vessel or unexpected calcification. Skull: No fracture or focal lesion. Sinuses/Orbits: Paranasal sinuses and mastoid air cells are clear. The visualized orbits are unremarkable. Other: None. IMPRESSION: Negative noncontrast head CT. Electronically  Signed   By: Keith Rake M.D.   On: 01/01/2020 20:46    ____________________________________________    PROCEDURES  Procedure(s) performed:     Procedures     Medications  sodium chloride 0.9 % bolus 1,000 mL (0 mLs Intravenous Stopped 01/01/20 2109)  dexamethasone (DECADRON) injection 10 mg (10 mg Intravenous Given 01/01/20 2031)  metoCLOPramide (REGLAN) injection 10 mg (10 mg Intravenous Given 01/01/20 2031)  diphenhydrAMINE (BENADRYL) injection 25 mg (25 mg Intravenous Given 01/01/20 2031)  ketorolac (TORADOL) 30 MG/ML injection 30 mg (30 mg Intravenous Given 01/01/20 2112)     ____________________________________________   INITIAL IMPRESSION / ASSESSMENT AND PLAN / ED COURSE  Pertinent labs & imaging results that were available during my care of the patient were reviewed by me and considered in my medical decision making (see chart for details).    Assessment and plan Migraine 54 year old male presents to the emergency department with headache  for the past several weeks.  Patient was hypertensive at triage but vital signs were otherwise reassuring.  Patient states that he does not currently take blood pressure medication and has not been diagnosed with hypertension in the past.  I recommended that patient follow-up with his primary care provider if elevated blood pressure persist over the next few weeks.  CT head revealed no evidence of intracranial bleed.  Patient was given migraine cocktail in the emergency department he reported that his headache completely resolved.  He was discharged with Toradol by mouth.  Return precautions were given to return with new or worsening symptoms.    ____________________________________________  FINAL CLINICAL IMPRESSION(S) / ED DIAGNOSES  Final diagnoses:  Migraine without aura and with status migrainosus, not intractable      NEW MEDICATIONS STARTED DURING THIS VISIT:  ED Discharge Orders         Ordered    ketorolac  (TORADOL) 10 MG tablet  Every 6 hours PRN     01/01/20 2114              This chart was dictated using voice recognition software/Dragon. Despite best efforts to proofread, errors can occur which can change the meaning. Any change was purely unintentional.     Gasper Lloyd 01/01/20 2232    Phineas Semen, MD 01/01/20 2237

## 2020-05-18 ENCOUNTER — Emergency Department: Payer: 59

## 2020-05-18 ENCOUNTER — Other Ambulatory Visit: Payer: Self-pay

## 2020-05-18 DIAGNOSIS — Z5321 Procedure and treatment not carried out due to patient leaving prior to being seen by health care provider: Secondary | ICD-10-CM | POA: Insufficient documentation

## 2020-05-18 DIAGNOSIS — R109 Unspecified abdominal pain: Secondary | ICD-10-CM | POA: Diagnosis present

## 2020-05-18 LAB — BASIC METABOLIC PANEL
Anion gap: 10 (ref 5–15)
BUN: 19 mg/dL (ref 6–20)
CO2: 24 mmol/L (ref 22–32)
Calcium: 9.1 mg/dL (ref 8.9–10.3)
Chloride: 105 mmol/L (ref 98–111)
Creatinine, Ser: 1.37 mg/dL — ABNORMAL HIGH (ref 0.61–1.24)
GFR calc Af Amer: >60 mL/min (ref >60)
GFR calc non Af Amer: 57 mL/min — ABNORMAL LOW (ref >60)
Glucose, Bld: 105 mg/dL — ABNORMAL HIGH (ref 70–99)
Potassium: 3.6 mmol/L (ref 3.5–5.1)
Sodium: 139 mmol/L (ref 135–145)

## 2020-05-18 LAB — CBC
HCT: 40 % (ref 39.0–52.0)
Hemoglobin: 14.3 g/dL (ref 13.0–17.0)
MCH: 31 pg (ref 26.0–34.0)
MCHC: 35.8 g/dL (ref 30.0–36.0)
MCV: 86.8 fL (ref 80.0–100.0)
Platelets: 144 10*3/uL — ABNORMAL LOW (ref 150–400)
RBC: 4.61 MIL/uL (ref 4.22–5.81)
RDW: 13 % (ref 11.5–15.5)
WBC: 6.4 10*3/uL (ref 4.0–10.5)
nRBC: 0 % (ref 0.0–0.2)

## 2020-05-18 MED ORDER — OXYCODONE-ACETAMINOPHEN 5-325 MG PO TABS
1.0000 | ORAL_TABLET | ORAL | Status: DC | PRN
  Administered 2020-05-18: 1 via ORAL
  Filled 2020-05-18: qty 1

## 2020-05-18 NOTE — ED Triage Notes (Signed)
Pt comes pov with right sided flank pain for 4 days. Has had stones before and states it feels the same.

## 2020-05-18 NOTE — ED Notes (Signed)
Patient called for ct with no answer

## 2020-05-19 ENCOUNTER — Emergency Department
Admission: EM | Admit: 2020-05-19 | Discharge: 2020-05-19 | Disposition: A | Discharge status: Home / Self Care | Payer: 59 | Attending: Emergency Medicine | Admitting: Emergency Medicine

## 2020-05-19 NOTE — ED Notes (Signed)
Patient called for ct with no answer. 

## 2020-05-19 NOTE — ED Notes (Signed)
Patient called for vital sign check with no answer. 

## 2020-05-20 ENCOUNTER — Ambulatory Visit
Admission: RE | Admit: 2020-05-20 | Discharge: 2020-05-20 | Disposition: A | Discharge status: Home / Self Care | Payer: 59 | Source: Ambulatory Visit | Attending: Family Medicine | Admitting: Family Medicine

## 2020-05-20 ENCOUNTER — Other Ambulatory Visit: Payer: Self-pay | Admitting: Family Medicine

## 2020-05-20 ENCOUNTER — Other Ambulatory Visit: Payer: Self-pay

## 2020-05-20 DIAGNOSIS — R1030 Lower abdominal pain, unspecified: Secondary | ICD-10-CM

## 2020-05-20 DIAGNOSIS — K5900 Constipation, unspecified: Secondary | ICD-10-CM

## 2020-05-20 MED ORDER — IOHEXOL 300 MG/ML  SOLN
125.0000 mL | Freq: Once | INTRAMUSCULAR | Status: AC | PRN
  Administered 2020-05-20: 125 mL via INTRAVENOUS

## 2020-05-21 ENCOUNTER — Other Ambulatory Visit: Payer: Self-pay | Admitting: Family Medicine

## 2020-05-21 DIAGNOSIS — N2889 Other specified disorders of kidney and ureter: Secondary | ICD-10-CM

## 2021-07-18 IMAGING — CT CT ABD-PELV W/ CM
2 of 5 series · 14 of 46 positions shown, 16 images · IV contrast (APPLIED)
Comparison: None.

CLINICAL DATA: Abdominal and right flank region pain

EXAM:
CT ABDOMEN AND PELVIS WITH CONTRAST
TECHNIQUE: Multidetector CT imaging of the abdomen and pelvis was performed
using the standard protocol following bolus administration of
intravenous contrast. Oral contrast was also administered.
CONTRAST:  125mL OMNIPAQUE IOHEXOL 300 MG/ML  SOLN

[Series 2: routine abd/pel with · axial · 0.91mm/px · z∈[-737,-227]mm · 11 of 114 slices shown, 13 images]
[im 6/114  soft-tissue]
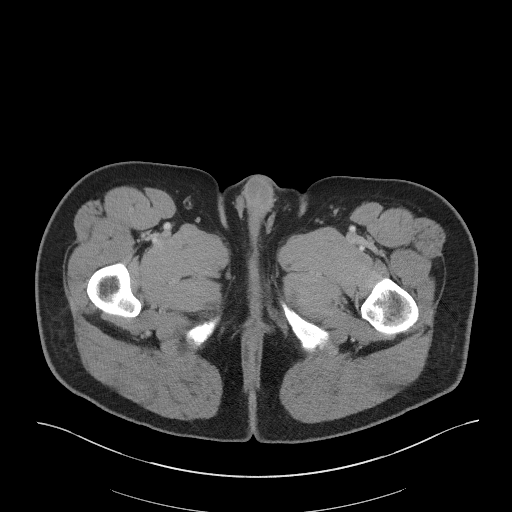
[im 6/114  bone]
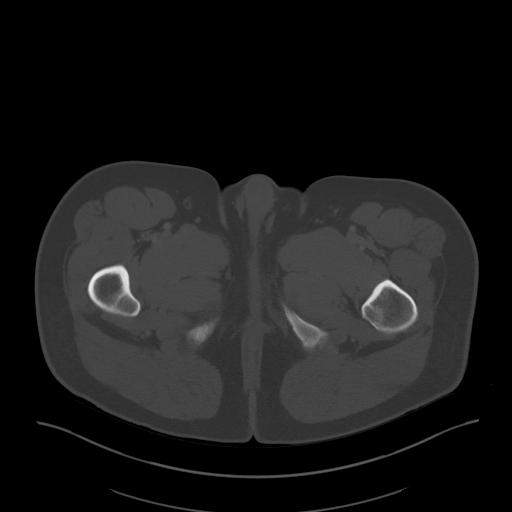
[im 18/114  soft-tissue]
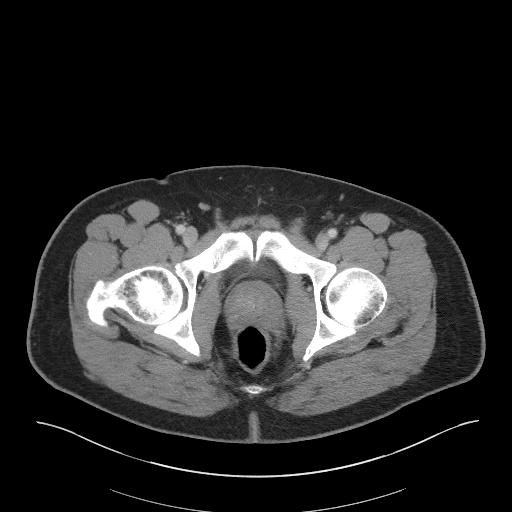
[im 30/114  soft-tissue]
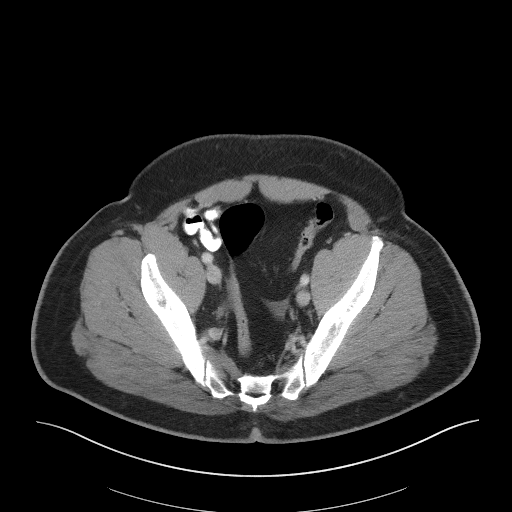
[im 36/114  soft-tissue]
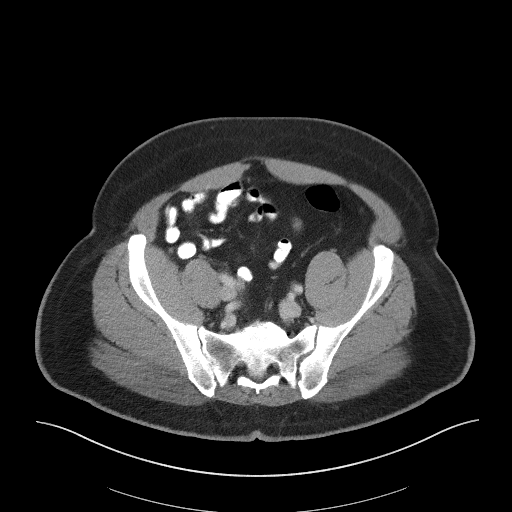
[im 48/114  soft-tissue]
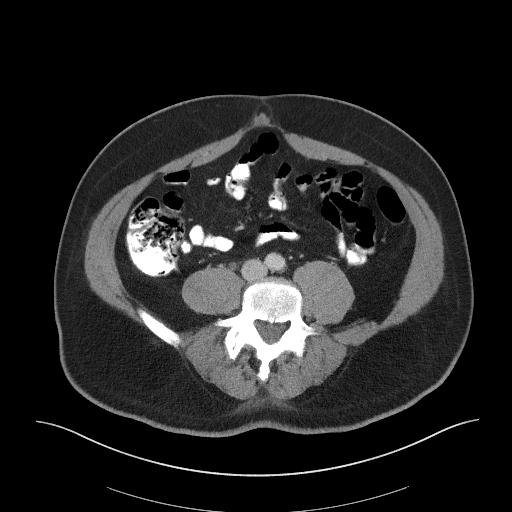
[im 60/114  soft-tissue]
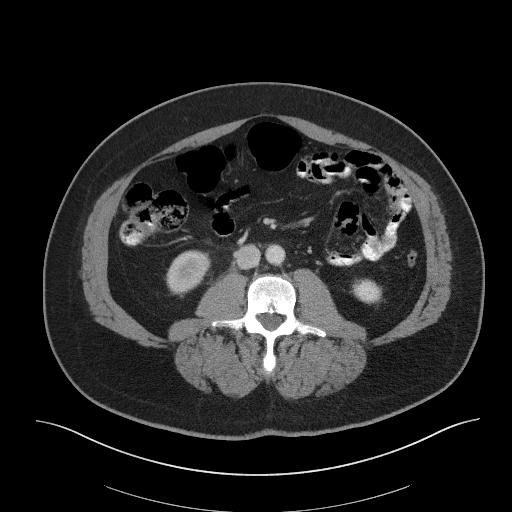
[im 66/114  soft-tissue]
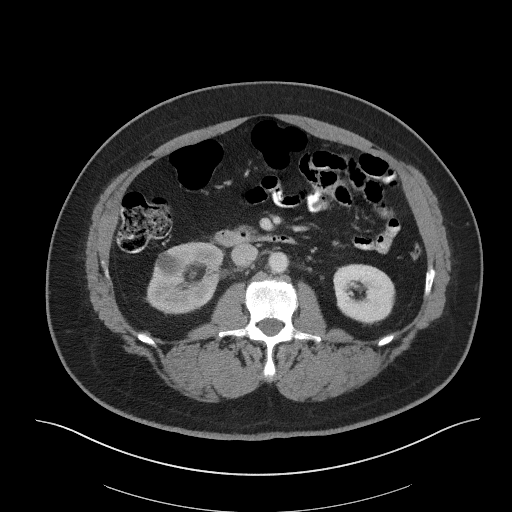
[im 78/114  soft-tissue]
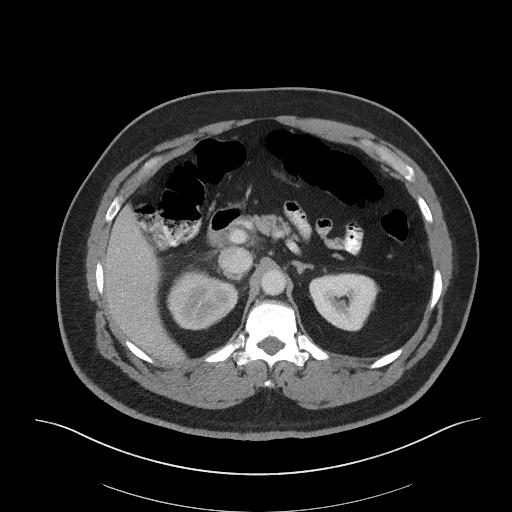
[im 84/114  soft-tissue]
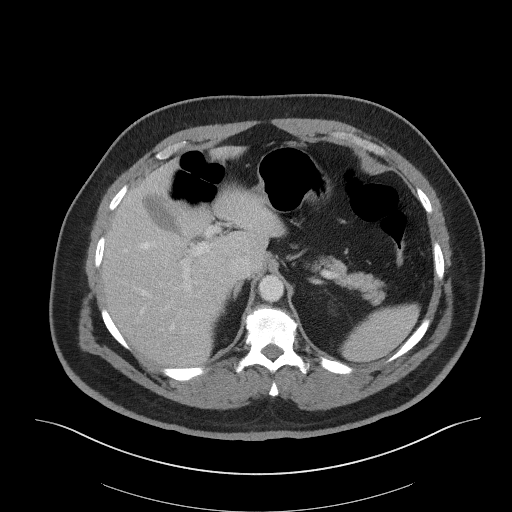
[im 84/114  bone]
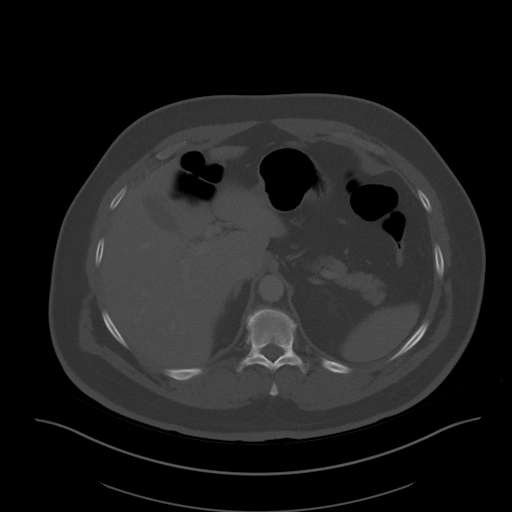
[im 96/114  soft-tissue]
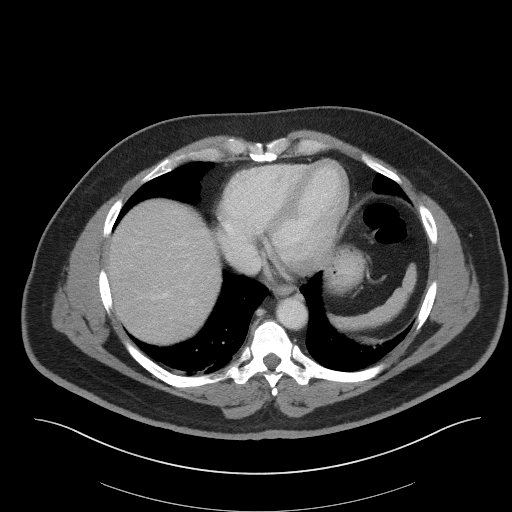
[im 108/114  soft-tissue]
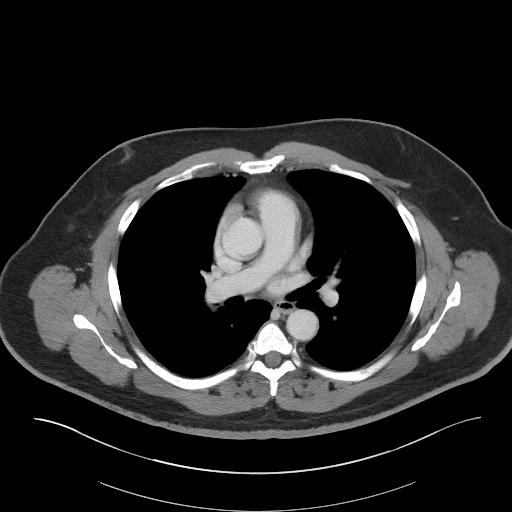

[Series 5: coronal st · coronal · 0.93mm/px · 3 of 111 slices shown]
[im 37/111  soft-tissue]
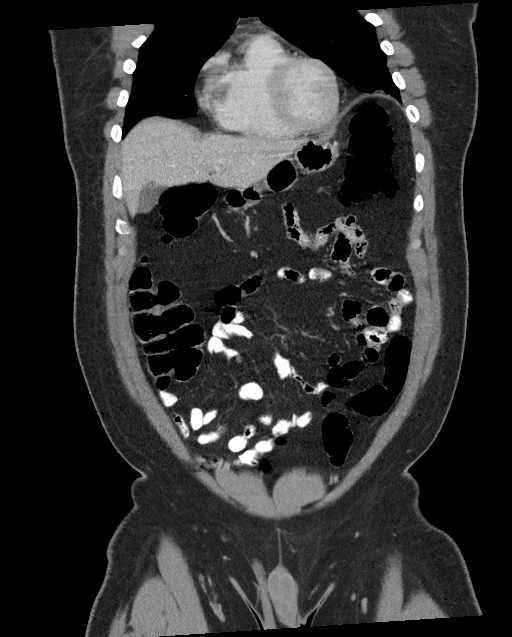
[im 49/111  soft-tissue]
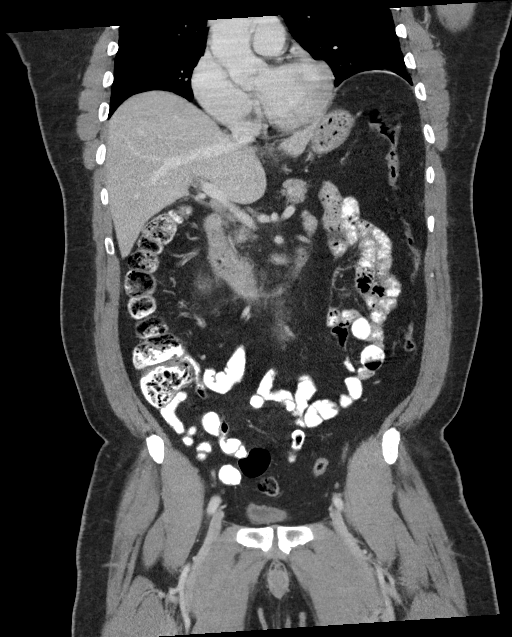
[im 62/111  soft-tissue]
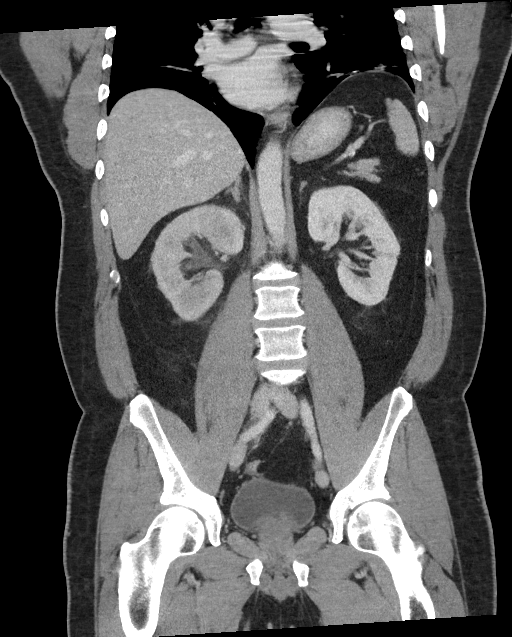

[14 of 46 positions shown; findings below may reference images not displayed]

FINDINGS: Lower chest: There is bibasilar atelectasis. No lung base edema or
airspace consolidation.

Hepatobiliary: No focal liver lesions are appreciable. Gallbladder
wall is not appreciably thickened. There is no biliary duct
dilatation.

Pancreas: There is no pancreatic mass or inflammatory focus.

Spleen: No splenic lesions are evident.

Adrenals/Urinary Tract: Adrenals bilaterally appear normal. Right
kidney is subtly edematous. There is a cyst in the mid right kidney
measuring 2.4 x 2.3 cm. There is a mass in the lateral mid left
kidney measuring 1.1 x 1.1 cm which has attenuation values higher
than is expected with a simple cyst. There is mild hydronephrosis on
the right. There is somewhat diminished enhancement of the right
kidney compared to the left consistent with obstructing lesion.
There is no intrarenal calculus on either side. There is edema
involving much of the right ureter. There is a 1 mm calculus at the
right ureterovesical junction. No other ureteral calculi are
evident. Urinary bladder is midline with wall thickness within
normal limits.

Stomach/Bowel: There is no appreciable bowel wall or mesenteric
thickening. Terminal ileum appears normal. No evident bowel
obstruction. There is no appreciable free air or portal venous air.

Vascular/Lymphatic: There is no abdominal aortic aneurysm. No
appreciable arterial vascular lesions are evident. Major venous
structures appear patent. There is no evident adenopathy in the
abdomen or pelvis.

Reproductive: Prostate is enlarged. There is loss of fat plane
between the prostate and inferior bladder. Prostate shows somewhat
inhomogeneous enhancement, a finding that potentially could
represent inflammatory or neoplastic etiology. Seminal vesicles
appear normal bilaterally.

Other: Appendix appears normal. No evident abscess or ascites in the
abdomen or pelvis. There is thinning of the rectus muscle at the
level of the umbilicus without frank herniation.

Musculoskeletal: Apparent no blastic or lytic bone lesions. There
are foci of degenerative change in the lumbar spine. No
intramuscular lesions are evident.
IMPRESSION: 1. There is a 1 mm calculus at the right ureterovesical junction
causing hydronephrosis and ureterectasis on the right. There is
subtle right renal and right ureteral edema. Diminished enhancement
of the right kidney compared to the left is consistent with distal
obstruction.

2.  There is a mass

In the mid left kidney measuring 1.1 x 1.1 cm which cannot be
classified as a cyst. Further evaluation with pre and post contrast
MRI should be considered. Pre and post contrast CT could
alternatively be performed, but would likely be of decreased
accuracy given lesion size.

3. Enlarged prostate with loss of fat plane between the prostate and
inferior bladder. This finding warrants clinical assessment of the
prostate as well as PSA evaluation. There appears to be asymmetric
prominence along the superior aspect of the bladder with somewhat
inhomogeneous enhancement in this area.

4. No bowel obstruction. No abscess in the abdomen or pelvis.
Appendix appears normal.

These results will be called to the ordering clinician or
representative by the Radiologist Assistant, and communication
documented in the PACS or [REDACTED].

## 2022-06-11 ENCOUNTER — Encounter: Payer: Self-pay | Admitting: Podiatry

## 2022-06-11 ENCOUNTER — Ambulatory Visit: Payer: PRIVATE HEALTH INSURANCE | Admitting: Podiatry

## 2022-06-11 ENCOUNTER — Ambulatory Visit (INDEPENDENT_AMBULATORY_CARE_PROVIDER_SITE_OTHER): Payer: PRIVATE HEALTH INSURANCE

## 2022-06-11 DIAGNOSIS — M722 Plantar fascial fibromatosis: Secondary | ICD-10-CM | POA: Diagnosis not present

## 2022-06-11 DIAGNOSIS — R52 Pain, unspecified: Secondary | ICD-10-CM | POA: Diagnosis not present

## 2022-06-11 MED ORDER — MELOXICAM 15 MG PO TABS: 15.0000 mg | ORAL_TABLET | Freq: Every day | ORAL | 1 refills | Status: AC

## 2022-06-11 MED ORDER — BETAMETHASONE SOD PHOS & ACET 6 (3-3) MG/ML IJ SUSP
3.0000 mg | Freq: Once | INTRAMUSCULAR | Status: AC
  Administered 2022-06-11: 3 mg via INTRA_ARTICULAR

## 2022-06-11 MED ORDER — METHYLPREDNISOLONE 4 MG PO TBPK: ORAL_TABLET | ORAL | 0 refills | Status: AC

## 2022-06-11 NOTE — Progress Notes (Signed)
   Chief Complaint  Patient presents with   Foot Pain    "My foot hurts on the bottom of my ankle." N - foot pain L - heel rt D - couple weeks O - suddenly C -sharp pain A - walking, standing on concrete floor T - none    Subjective: 56 y.o. male presenting to the office today for evaluation of right heel pain has been going on about 2-3 weeks now.  Initially began after mowing his yard and a pair of flat shoes.  It has been painful and tender ever since.  Denies any history of trauma or injury.  He has not done anything for treatment.  He does work standing on concrete floors all day.   Past Medical History:  Diagnosis Date   Hyperlipemia    Medical history non-contributory    Obesity      Objective: Physical Exam General: The patient is alert and oriented x3 in no acute distress.  Dermatology: Skin is warm, dry and supple bilateral lower extremities. Negative for open lesions or macerations bilateral.   Vascular: Dorsalis Pedis and Posterior Tibial pulses palpable bilateral.  Capillary fill time is immediate to all digits.  Neurological: Epicritic and protective threshold intact bilateral.   Musculoskeletal: Tenderness to palpation to the plantar aspect of the right heel along the plantar fascia. All other joints range of motion within normal limits bilateral. Strength 5/5 in all groups bilateral.   Radiographic exam: Normal osseous mineralization. Joint spaces preserved. No fracture/dislocation/boney destruction. No other soft tissue abnormalities or radiopaque foreign bodies.   Assessment: 1. Plantar fasciitis right  Plan of Care:  1. Patient evaluated. Xrays reviewed.   2. Injection of 0.5cc Celestone soluspan injected into the right plantar fascia  3. Rx for Medrol Dose Pack placed 4. Rx for Meloxicam ordered for patient. 5. Plantar fascial band(s) dispensed 6. Instructed patient regarding therapies and modalities at home to alleviate symptoms.  7.   Appointment for custom molded orthotics with our orthotics department  8.  Return to clinic in 4 weeks.    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 Edrick Kins, DPM    [address], [place] [address]                Office [phone]  Fax [phone]

## 2022-06-18 ENCOUNTER — Other Ambulatory Visit: Payer: PRIVATE HEALTH INSURANCE

## 2022-07-19 ENCOUNTER — Telehealth: Payer: Self-pay | Admitting: Podiatry

## 2022-07-19 NOTE — Telephone Encounter (Signed)
No answer no vm , tried to reach pt orthotics are in , needs to schedule appt in [place] ( orthotics currently being shipped to [facility] (12/4)   Charges need put in sending note to Dr Logan Bores

## 2022-08-03 ENCOUNTER — Telehealth: Payer: Self-pay | Admitting: *Deleted

## 2022-08-03 NOTE — Telephone Encounter (Signed)
I attempted to call the patient and schedule him an appointment to pick up his orthotics.  He did not answer.  I could not leave a message because he has not set up his voicemail.

## 2022-08-25 ENCOUNTER — Ambulatory Visit (INDEPENDENT_AMBULATORY_CARE_PROVIDER_SITE_OTHER): Payer: PRIVATE HEALTH INSURANCE | Admitting: *Deleted

## 2022-08-25 DIAGNOSIS — M722 Plantar fascial fibromatosis: Secondary | ICD-10-CM

## 2022-08-25 NOTE — Progress Notes (Signed)
Patient presents today to pick up custom molded foot orthotics, diagnosed with plantar fasciitis by Dr. Posey Pronto.   Orthotics were dispensed and fit was satisfactory. Reviewed instructions for break-in and wear. Written instructions given to patient.  Patient will follow up as needed.   [facility] - order # [id number]

## 2023-05-26 ENCOUNTER — Emergency Department
Admission: EM | Admit: 2023-05-26 | Discharge: 2023-05-26 | Disposition: A | Discharge status: Home / Self Care | Payer: 59 | Attending: Emergency Medicine | Admitting: Emergency Medicine

## 2023-05-26 ENCOUNTER — Emergency Department: Payer: 59

## 2023-05-26 ENCOUNTER — Other Ambulatory Visit: Payer: Self-pay

## 2023-05-26 DIAGNOSIS — N5082 Scrotal pain: Secondary | ICD-10-CM | POA: Diagnosis not present

## 2023-05-26 DIAGNOSIS — N50811 Right testicular pain: Secondary | ICD-10-CM | POA: Diagnosis present

## 2023-05-26 DIAGNOSIS — I10 Essential (primary) hypertension: Secondary | ICD-10-CM | POA: Insufficient documentation

## 2023-05-26 NOTE — ED Triage Notes (Signed)
Pt to ed from home via POV for testicle pain that started yesterday. Pt advised it is swollen and painful but is not read. Pt is caox4, in no acute distress and ambulatory in triage. Pt was just seen at [facility] and was sent over here. Pt is a Producer, television/film/video.

## 2023-05-26 NOTE — ED Provider Notes (Signed)
[facility] Provider Note    Event Date/Time   First MD Initiated Contact with Patient 05/26/23 1550     (approximate)   History   Testicle Pain (Right )   HPI  ESLI JERNIGAN is a 57 y.o. male with PMH of obesity, HLD and osteoarthritis, presents for evaluation of testicular pain.  Patient states that he was at work yesterday and did some heavy lifting and felt a pop. Since this his testicle as been swollen and painful. He declines any urinary symptoms. He was sent over from the workmen's comp provider for a testicular ultrasound.       Physical Exam   Triage Vital Signs: ED Triage Vitals  Encounter Vitals Group     BP 05/26/23 1414 (S) (!) 169/97     Systolic BP Percentile --      Diastolic BP Percentile --      Pulse Rate 05/26/23 1414 80     Resp 05/26/23 1414 16     Temp 05/26/23 1414 98.2 F (36.8 C)     Temp Source 05/26/23 1414 Oral     SpO2 05/26/23 1414 98 %     Weight 05/26/23 1415 250 lb (113.4 kg)     Height 05/26/23 1415 5\' 10"  (1.778 m)     Head Circumference --      Peak Flow --      Pain Score 05/26/23 1415 4     Pain Loc --      Pain Education --      Exclude from Growth Chart --     Most recent vital signs: Vitals:   05/26/23 1414  BP: (S) (!) 169/97  Pulse: 80  Resp: 16  Temp: 98.2 F (36.8 C)  SpO2: 98%    General: Awake, no distress.  CV:  Good peripheral perfusion.  Resp:  Normal effort.  Abd:  No distention.  Other:  Right testicle is swollen when compared with the left, TTP, no overlying skin changes   ED Results / Procedures / Treatments   Labs (all labs ordered are listed, but only abnormal results are displayed) Labs Reviewed - No data to display  RADIOLOGY  Scrotal ultrasound obtained, interpreted the images as well as reviewed the radiologist report which is negative for testicular torsion, mass and epididymitis.  It did show a cyst in both testicles.  IMPRESSION:  1. No evidence of  testicular torsion or mass. No evidence of  epididymitis or orchitis.  2. Cyst with internal debris in the left testicle.  3. Small tunica cyst in the right testicle.    PROCEDURES:  Critical Care performed: No  Procedures   MEDICATIONS ORDERED IN ED: Medications - No data to display   IMPRESSION / MDM / ASSESSMENT AND PLAN / ED COURSE  I reviewed the triage vital signs and the nursing notes.                             57 year old male presents for evaluation of testicular pain.  Patient was hypertensive in triage otherwise vital signs stable.  Differential diagnosis includes, but is not limited to, inguinal hernia, cellulitis, epididymitis, torsion, reseal, varicocele, orchitis.  Patient's presentation is most consistent with acute complicated illness / injury requiring diagnostic workup.  Scrotal ultrasound obtained, interpreted the images as well as reviewed the radiologist report which is negative for testicular torsion, mass and epididymitis.  It did show a cyst in  both testicles.  Based on patient's history of suspicious of a hernia.  There is no overlying skin changes on the testicle.  I was not able to reduce any of the tissue. However, I would have expected the ultrasound to show a hernia if it was present.  Patient reports that the pain in his testicle is minimal and that he came to the ED because the Workmen's Comp. provider wanted him to be evaluated here.  Since his scrotal ultrasound is normal aside from the cysts I feel he is stable for outpatient management.  I did advise him to elevate his scrotum as this will help with the swelling.  He stated that he has a follow-up appointment with the Workmen's Comp. provider on Monday, which I advised him to keep.  He was also given return precautions like sudden onset of severe pain.  He voiced understanding, all questions were answered and he was stable at discharge.    FINAL CLINICAL IMPRESSION(S) / ED DIAGNOSES    Final diagnoses:  Scrotal pain     Rx / DC Orders   ED Discharge Orders     None        Note:  This document was prepared using Dragon voice recognition software and may include unintentional dictation errors.   Cameron Ali, PA-C 05/27/23 0040    Dionne Bucy, MD 05/27/23 [phone]

## 2023-05-26 NOTE — ED Notes (Signed)
Ultrasound at bedside

## 2023-05-26 NOTE — Discharge Instructions (Signed)
Your ultrasound was normal today, it did show that you have a small cyst in both testicles, however I do not feel that this explains your pain.  Please follow-up with the Workmen's Comp. provider on Monday.  Return to the ED if you have sudden onset of severe pain.  You can take 650 mg of Tylenol and 600 mg of ibuprofen every 6 hours as needed for pain.

## 2023-05-27 ENCOUNTER — Telehealth: Payer: Self-pay | Admitting: Emergency Medicine

## 2023-05-27 NOTE — Telephone Encounter (Signed)
Called patient due to over read on ultrasound shows hydrocele.  Per dr Fuller Plan needs additional instruction to follow up with urology.  Dr. Richardo Hanks was on call during his visit.  I spoke to him and explained and gave him follow up and number to call for appt.

## 2024-01-02 ENCOUNTER — Ambulatory Visit: Payer: Self-pay

## 2024-01-02 DIAGNOSIS — D120 Benign neoplasm of cecum: Secondary | ICD-10-CM | POA: Diagnosis not present

## 2024-01-02 DIAGNOSIS — Z1211 Encounter for screening for malignant neoplasm of colon: Secondary | ICD-10-CM | POA: Diagnosis present
